# Patient Record
Sex: Male | Born: 1975 | Race: White | Hispanic: No | Marital: Married | Smoking: Current every day smoker
Health system: Southern US, Community
[De-identification: ages and names within clinical notes are randomized; demographics above are authoritative.]

## PROBLEM LIST (undated history)

## (undated) DIAGNOSIS — G919 Hydrocephalus, unspecified: Secondary | ICD-10-CM

## (undated) DIAGNOSIS — Z982 Presence of cerebrospinal fluid drainage device: Secondary | ICD-10-CM

---

## 2014-11-30 ENCOUNTER — Encounter (HOSPITAL_COMMUNITY): Payer: Self-pay | Admitting: Emergency Medicine

## 2014-11-30 ENCOUNTER — Emergency Department (HOSPITAL_COMMUNITY)
Admission: EM | Admit: 2014-11-30 | Discharge: 2014-11-30 | Disposition: A | Discharge status: Home / Self Care | Payer: Self-pay | Attending: Emergency Medicine | Admitting: Emergency Medicine

## 2014-11-30 ENCOUNTER — Emergency Department (HOSPITAL_COMMUNITY): Payer: Self-pay

## 2014-11-30 DIAGNOSIS — Y929 Unspecified place or not applicable: Secondary | ICD-10-CM | POA: Insufficient documentation

## 2014-11-30 DIAGNOSIS — H9223 Otorrhagia, bilateral: Secondary | ICD-10-CM

## 2014-11-30 DIAGNOSIS — R51 Headache: Secondary | ICD-10-CM | POA: Insufficient documentation

## 2014-11-30 DIAGNOSIS — S8992XA Unspecified injury of left lower leg, initial encounter: Secondary | ICD-10-CM | POA: Insufficient documentation

## 2014-11-30 DIAGNOSIS — Z720 Tobacco use: Secondary | ICD-10-CM | POA: Insufficient documentation

## 2014-11-30 DIAGNOSIS — M25562 Pain in left knee: Secondary | ICD-10-CM

## 2014-11-30 DIAGNOSIS — R059 Cough, unspecified: Secondary | ICD-10-CM

## 2014-11-30 DIAGNOSIS — Y9389 Activity, other specified: Secondary | ICD-10-CM | POA: Insufficient documentation

## 2014-11-30 DIAGNOSIS — J069 Acute upper respiratory infection, unspecified: Secondary | ICD-10-CM

## 2014-11-30 DIAGNOSIS — Y998 Other external cause status: Secondary | ICD-10-CM | POA: Insufficient documentation

## 2014-11-30 DIAGNOSIS — W1839XA Other fall on same level, initial encounter: Secondary | ICD-10-CM | POA: Insufficient documentation

## 2014-11-30 DIAGNOSIS — R05 Cough: Secondary | ICD-10-CM

## 2014-11-30 HISTORY — DX: Presence of cerebrospinal fluid drainage device: Z98.2

## 2014-11-30 HISTORY — DX: Hydrocephalus, unspecified: G91.9

## 2014-11-30 MED ORDER — OXYCODONE-ACETAMINOPHEN 5-325 MG PO TABS: 1.0000 | ORAL_TABLET | ORAL | Status: AC | PRN

## 2014-11-30 MED ORDER — AZITHROMYCIN 250 MG PO TABS: ORAL_TABLET | ORAL | Status: AC

## 2014-11-30 NOTE — ED Notes (Signed)
Pt A+Ox4, reports L knee pain 8/10, medial aspect, after falling through a debris pile yesterday "i was looking for something".  Pt denies hitting head or LOC.  Pt reports pain worse with movement.  Mild bruising and swelling noted.  Pt denies n/t to extremity.  Ambulatory with steady gait.  Skin PWD.  Speaking full/clear sentences.  NAD.

## 2014-11-30 NOTE — Discharge Instructions (Signed)
Please return if you develop fever, nausea, vomiting, visual changes, or swelling in the bone behind your ear.   Knee Pain The knee is the complex joint between your thigh and your lower leg. It is made up of bones, tendons, ligaments, and cartilage. The bones that make up the knee are:  The femur in the thigh.  The tibia and fibula in the lower leg.  The patella or kneecap riding in the groove on the lower femur. CAUSES  Knee pain is a common complaint with many causes. A few of these causes are:  Injury, such as:  A ruptured ligament or tendon injury.  Torn cartilage.  Medical conditions, such as:  Gout  Arthritis  Infections  Overuse, over training, or overdoing a physical activity. Knee pain can be minor or severe. Knee pain can accompany debilitating injury. Minor knee problems often respond well to self-care measures or get well on their own. More serious injuries may need medical intervention or even surgery. SYMPTOMS The knee is complex. Symptoms of knee problems can vary widely. Some of the problems are:  Pain with movement and weight bearing.  Swelling and tenderness.  Buckling of the knee.  Inability to straighten or extend your knee.  Your knee locks and you cannot straighten it.  Warmth and redness with pain and fever.  Deformity or dislocation of the kneecap. DIAGNOSIS  Determining what is wrong may be very straight forward such as when there is an injury. It can also be challenging because of the complexity of the knee. Tests to make a diagnosis may include:  Your caregiver taking a history and doing a physical exam.  Routine X-rays can be used to rule out other problems. X-rays will not reveal a cartilage tear. Some injuries of the knee can be diagnosed by:  Arthroscopy a surgical technique by which a small video camera is inserted through tiny incisions on the sides of the knee. This procedure is used to examine and repair internal knee joint  problems. Tiny instruments can be used during arthroscopy to repair the torn knee cartilage (meniscus).  Arthrography is a radiology technique. A contrast liquid is directly injected into the knee joint. Internal structures of the knee joint then become visible on X-ray film.  An MRI scan is a non X-ray radiology procedure in which magnetic fields and a computer produce two- or three-dimensional images of the inside of the knee. Cartilage tears are often visible using an MRI scanner. MRI scans have largely replaced arthrography in diagnosing cartilage tears of the knee.  Blood work.  Examination of the fluid that helps to lubricate the knee joint (synovial fluid). This is done by taking a sample out using a needle and a syringe. TREATMENT The treatment of knee problems depends on the cause. Some of these treatments are:  Depending on the injury, proper casting, splinting, surgery, or physical therapy care will be needed.  Give yourself adequate recovery time. Do not overuse your joints. If you begin to get sore during workout routines, back off. Slow down or do fewer repetitions.  For repetitive activities such as cycling or running, maintain your strength and nutrition.  Alternate muscle groups. For example, if you are a weight lifter, work the upper body on one day and the lower body the next.  Either tight or weak muscles do not give the proper support for your knee. Tight or weak muscles do not absorb the stress placed on the knee joint. Keep the muscles surrounding the  knee strong.  Take care of mechanical problems.  If you have flat feet, orthotics or special shoes may help. See your caregiver if you need help.  Arch supports, sometimes with wedges on the inner or outer aspect of the heel, can help. These can shift pressure away from the side of the knee most bothered by osteoarthritis.  A brace called an "unloader" brace also may be used to help ease the pressure on the most  arthritic side of the knee.  If your caregiver has prescribed crutches, braces, wraps or ice, use as directed. The acronym for this is PRICE. This means protection, rest, ice, compression, and elevation.  Nonsteroidal anti-inflammatory drugs (NSAIDs), can help relieve pain. But if taken immediately after an injury, they may actually increase swelling. Take NSAIDs with food in your stomach. Stop them if you develop stomach problems. Do not take these if you have a history of ulcers, stomach pain, or bleeding from the bowel. Do not take without your caregiver's approval if you have problems with fluid retention, heart failure, or kidney problems.  For ongoing knee problems, physical therapy may be helpful.  Glucosamine and chondroitin are over-the-counter dietary supplements. Both may help relieve the pain of osteoarthritis in the knee. These medicines are different from the usual anti-inflammatory drugs. Glucosamine may decrease the rate of cartilage destruction.  Injections of a corticosteroid drug into your knee joint may help reduce the symptoms of an arthritis flare-up. They may provide pain relief that lasts a few months. You may have to wait a few months between injections. The injections do have a small increased risk of infection, water retention, and elevated blood sugar levels.  Hyaluronic acid injected into damaged joints may ease pain and provide lubrication. These injections may work by reducing inflammation. A series of shots may give relief for as long as 6 months.  Topical painkillers. Applying certain ointments to your skin may help relieve the pain and stiffness of osteoarthritis. Ask your pharmacist for suggestions. Many over the-counter products are approved for temporary relief of arthritis pain.  In some countries, doctors often prescribe topical NSAIDs for relief of chronic conditions such as arthritis and tendinitis. A review of treatment with NSAID creams found that they  worked as well as oral medications but without the serious side effects. PREVENTION  Maintain a healthy weight. Extra pounds put more strain on your joints.  Get strong, stay limber. Weak muscles are a common cause of knee injuries. Stretching is important. Include flexibility exercises in your workouts.  Be smart about exercise. If you have osteoarthritis, chronic knee pain or recurring injuries, you may need to change the way you exercise. This does not mean you have to stop being active. If your knees ache after jogging or playing basketball, consider switching to swimming, water aerobics, or other low-impact activities, at least for a few days a week. Sometimes limiting high-impact activities will provide relief.  Make sure your shoes fit well. Choose footwear that is right for your sport.  Protect your knees. Use the proper gear for knee-sensitive activities. Use kneepads when playing volleyball or laying carpet. Buckle your seat belt every time you drive. Most shattered kneecaps occur in car accidents.  Rest when you are tired. SEEK MEDICAL CARE IF:  You have knee pain that is continual and does not seem to be getting better.  SEEK IMMEDIATE MEDICAL CARE IF:  Your knee joint feels hot to the touch and you have a high fever. MAKE SURE YOU:  Understand these instructions.  Will watch your condition.  Will get help right away if you are not doing well or get worse. Document Released: 09/30/2007 Document Revised: 02/25/2012 Document Reviewed: 09/30/2007 [facility] Patient Information 2015 [employer] [place]. This information is not intended to replace advice given to you by your health care provider. Make sure you discuss any questions you have with your health care provider. Knee, Cartilage (Meniscus) Injury It is suspected that you have a torn cartilage (meniscus) in your knee. The menisci are made of tough cartilage and fit between the surfaces of the thigh and leg bones. The menisci are  C-shaped and have a wedged profile. The wedged profile helps the stability of the joint by keeping the rounded femur surface from sliding off the flat tibial surface. The menisci are fed (nourished) by small blood vessels, but there is also a large area at the inner edge of the meniscus that does not have a good blood supply (avascular). This presents a problem when there is an injury to the meniscus because areas without good blood supply heal poorly. As a result when there is a torn cartilage in the knee, surgery is often required to fix it. This is usually done with a surgical procedure less invasive than open surgery (arthroscopy). Some times open surgery of the knee is required if there is other damage. PURPOSE OF THE MENISCUS The medial meniscus rests on the medial tibial plateau. The tibia is the large bone in your lower leg (the shin bone). The medial tibial plateau is the upper end of the bone making up the inner part of your knee. The lateral meniscus serves the same purpose and is located on the outside of the knee. The menisci help to distribute your body weight across the knee joint; they act as shock absorbers. Without the meniscus present, the weight of your body would be unevenly applied to the bones in your legs (the femur and tibia). The femur is the large bone in your thigh. This uneven weight distribution would cause increased wear and tear on the cartilage lining the joint surfaces, leading to early damage (arthritis) of these areas. The presence of the menisci cartilage is necessary for a healthy knee. PURPOSE OF THE KNEE CARTILAGE The knee joint is made up of three bones: the thigh bone (femur), the shin bone (tibia), and the knee cap (patella). The surfaces of these bones at the knee joint are covered with cartilage called articular cartilage. This smooth, slippery surface allows the bones to slide against each other without causing bone damage. The meniscus sits between these  cartilaginous surfaces of the bones. It distributes the weight evenly in the joints and helps with the stability of the joint (keeps the joint steady). HOME CARE INSTRUCTIONS  Use crutches and external braces as instructed.  Once home, an ice pack applied to your injured knee may help with discomfort and keep the swelling down. An ice pack can be used for the first couple of days or as instructed.  Only take over-the-counter or prescription medicines for pain, discomfort, or fever as directed by your caregiver.  Call if you do not have relief of pain with medications or if there is increasing in pain.  Call if your foot becomes cold or blue.  You may resume normal diet and activities as directed.  Make sure to keep your appointments with your follow-up caregiver. This injury may require further evaluation and treatment beyond the temporary treatment given today. Document Released: 02/23/2003 Document  Revised: 04/19/2014 Document Reviewed: 06/17/2009 [facility] Patient Information 2015 [place], [place]. This information is not intended to replace advice given to you by your health care provider. Make sure you discuss any questions you have with your health care provider.

## 2014-11-30 NOTE — ED Notes (Signed)
Pt ambulatory with steady gait to radiology 

## 2014-11-30 NOTE — ED Provider Notes (Signed)
CSN: [id number]     Arrival date & time 11/30/14  1226 History   This chart was scribed for Arthor CaptainAbigail Christopherjohn Schiele, PA-C, working with Rolan BuccoMelanie Belfi, MD by Jolene Provostobert Halas, ED Scribe. This patient was seen in room WTR5/WTR5 and the patient's care was started at 12:56 PM.    Chief Complaint  Patient presents with  . Knee Pain    Lknee after fall through a debris pile yesterday    HPI  HPI Comments: Nicholas Marshall is a 38 y.o. male who presents to the Emergency Department complaining of sharp persistent left knee pain, worse with walking, that started yesterday afternoon after a fall. Pt states he was crawling on a pile of rubble and his left leg slipped though the surface of the rubble. Pt states he believes he twisted it when it slipped thorough the pile. Pt endorses feelings of grinding, sliding in the joint, swelling, and weakness in left knee. Pt states he took ibuprofen PTA.  Pt is also complaining of bleeding from his left ear. Pt endorses associated global pounding HA, and visual disturbance, dizziness, nasal congestion. Pt denies fever, chills. PT [facility] Pt states he work up this morning and his pillow was covered with blood.   Past Medical History  Diagnosis Date  . S/P VP shunt   . Hydrocephalus    History reviewed. No pertinent past surgical history. No family history on file. History  Substance Use Topics  . Smoking status: Current Every Day Smoker  . Smokeless tobacco: Not on file  . Alcohol Use: No    Review of Systems  HENT: Positive for ear discharge.   Respiratory: Positive for cough and shortness of breath.   Gastrointestinal: Negative for nausea and vomiting.  Musculoskeletal: Positive for joint swelling and gait problem.  Neurological: Positive for dizziness and headaches. Negative for tremors, seizures, syncope, facial asymmetry, speech difficulty, weakness and light-headedness.  All other systems reviewed and are negative.   Allergies   Hydrocodone and Tramadol  Home Medications   Prior to Admission medications   Not on File   BP 136/84 mmHg  Pulse 95  Temp(Src) 99.4 F (37.4 C) (Oral)  Resp 18  SpO2 94% Physical Exam  Constitutional: He is oriented to person, place, and time. He appears well-developed and well-nourished.  HENT:  Head: Normocephalic and atraumatic.  Dried blood left canal, as well as spot on dried blood on TM. Mastoid tenderness on left. Right ear with dried blood in canal, blood on TM as well as a circular lesion. TM  is opaque and yellow on appearance. No mastoid tenderness on right.  Eyes: Pupils are equal, round, and reactive to light.  Neck: Neck supple.  Cardiovascular: Normal rate and regular rhythm.   Pulmonary/Chest: Effort normal and breath sounds normal. No respiratory distress.  Musculoskeletal: He exhibits edema and tenderness.  Tenderness along the medial joint line, minimal swelling ROM limited due to pain. Full extension pain past 90 degrees of flexion. Positive McMurray's test.   Neurological: He is alert and oriented to person, place, and time.  Speech is clear and goal oriented, follows commands Major Cranial nerves without deficit, no facial droop Normal strength in upper and lower extremities bilaterally including dorsiflexion and plantar flexion, strong and equal grip strength Sensation normal to light and sharp touch Moves extremities without ataxia, coordination intact Normal finger to nose and rapid alternating movements Neg romberg, no pronator drift Normal heel-shin and balance   Skin:  Skin is warm and dry.  Psychiatric: He has a normal mood and affect. His behavior is normal.  Nursing note and vitals reviewed.   ED Course  Procedures  DIAGNOSTIC STUDIES: Oxygen Saturation is 94% on RA, adequate by my interpretation.    COORDINATION OF CARE: 1:04 PM Discussed treatment plan with pt at bedside and pt agreed to plan.  Labs Review Labs Reviewed - No data to  display  Imaging Review No results found.   EKG Interpretation None      MDM   Final diagnoses:  Left knee pain  Cough  URI (upper respiratory infection)  Bleeding from ear, bilateral    Patient with Negative L knee xray. Suspect meniscal tear. Knee immobilizer, crutches and pain medication. Follow-up with orthopedics. Concerning the patient's ears and left mastoid tenderness. Patient is unable to stay today due to the fact that he has to go pick his children up from school. I discussed this with the patient and will discharge him with azithromycin. I discussed reasons that he should seek immediate medical attention at the emergency department. Patient has follow-up appointment at his neurosurgeon's this coming Thursday. He appears safe for discharge at this time. He has no focal neuro deficits.  I personally performed the services described in this documentation, which was scribed in my presence. The recorded information has been reviewed and is accurate.      Arthor CaptainAbigail Jasma Seevers, PA-C 11/30/14 [id number]  Rolan BuccoMelanie Belfi, MD 12/03/14 [phone]

## 2014-11-30 NOTE — ED Notes (Signed)
Knee immobilizer and crutches provided.  Teaching provided, pt verbalized and demonstrated understanding.

## 2016-05-04 IMAGING — CR DG KNEE COMPLETE 4+V*L*
4 series · 4 of 4 positions shown · non-contrast
Comparison: None.

CLINICAL DATA: Medial left knee pain since a fall 11/29/2014.
Initial encounter.

EXAM:
LEFT KNEE - COMPLETE 4+ VIEW

[t knee ap left]
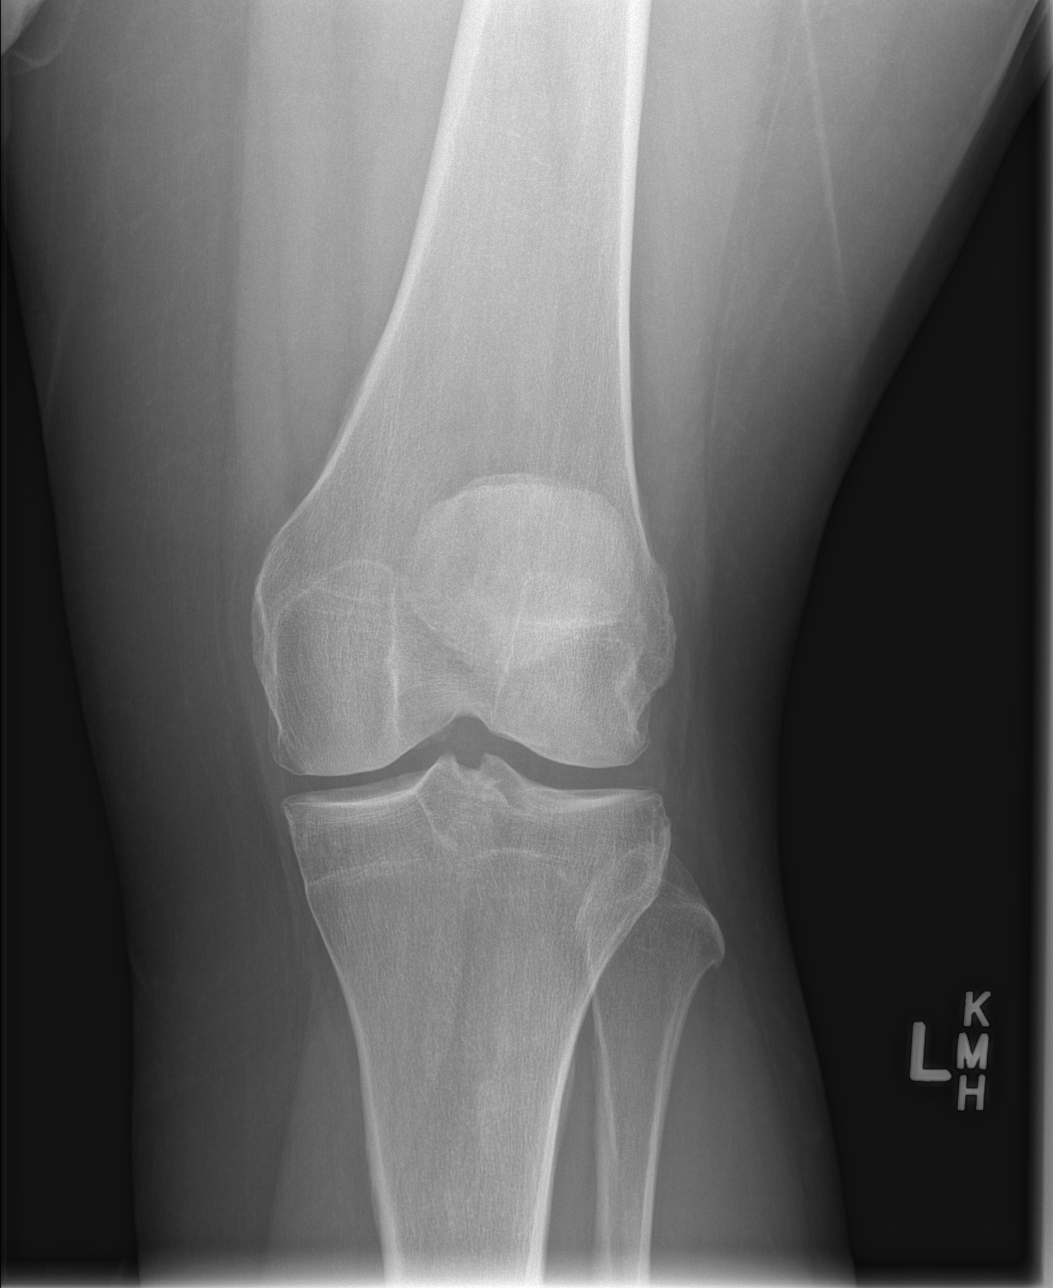

[t knee obl left (1 of 2)]
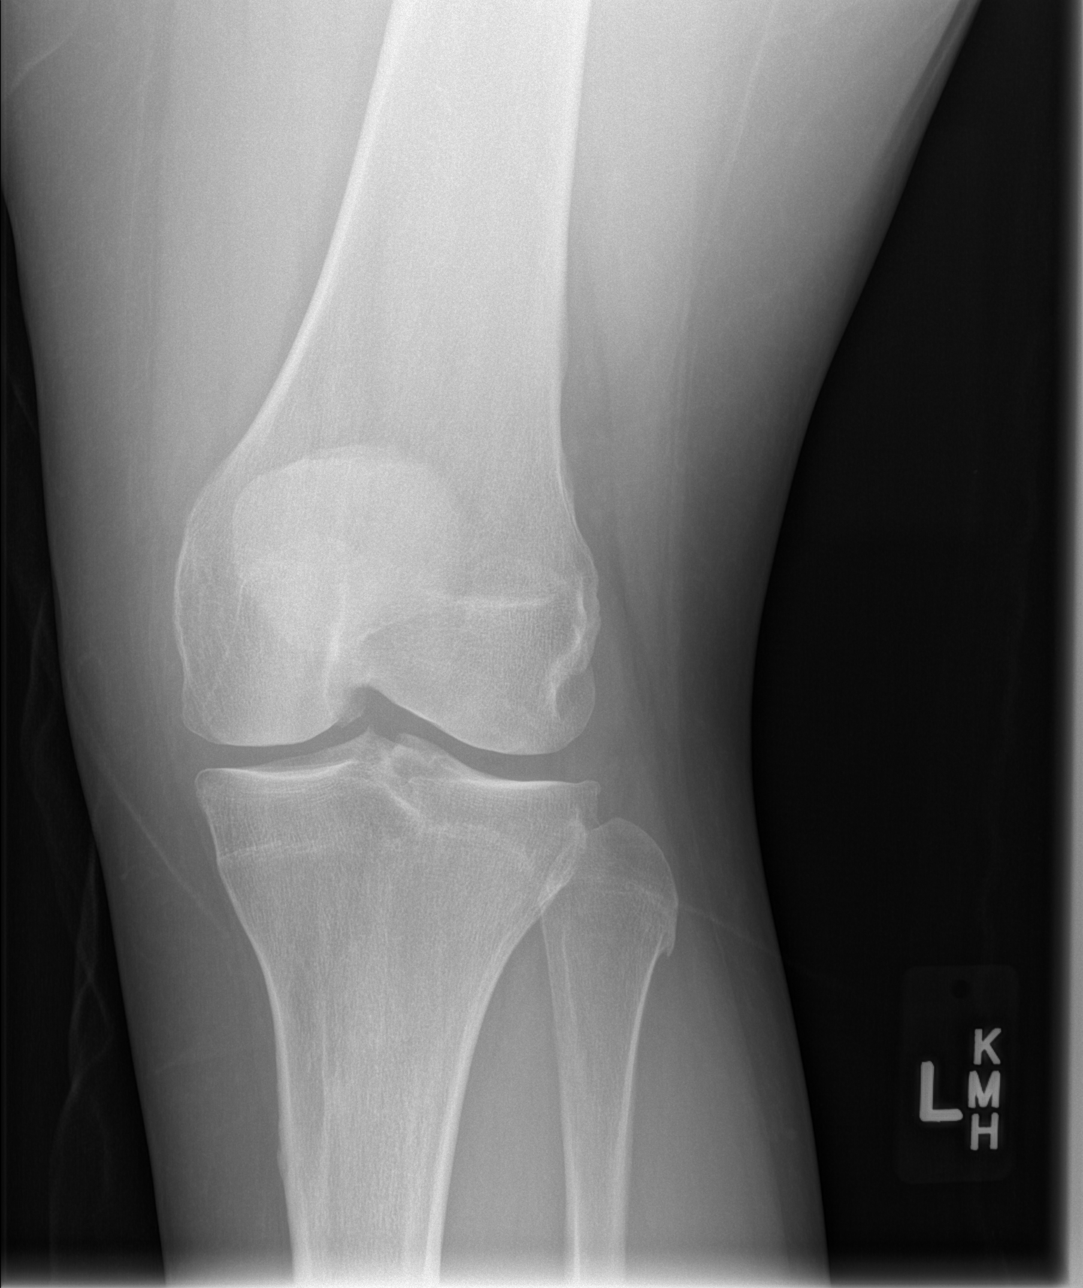

[t knee obl left (2 of 2)]
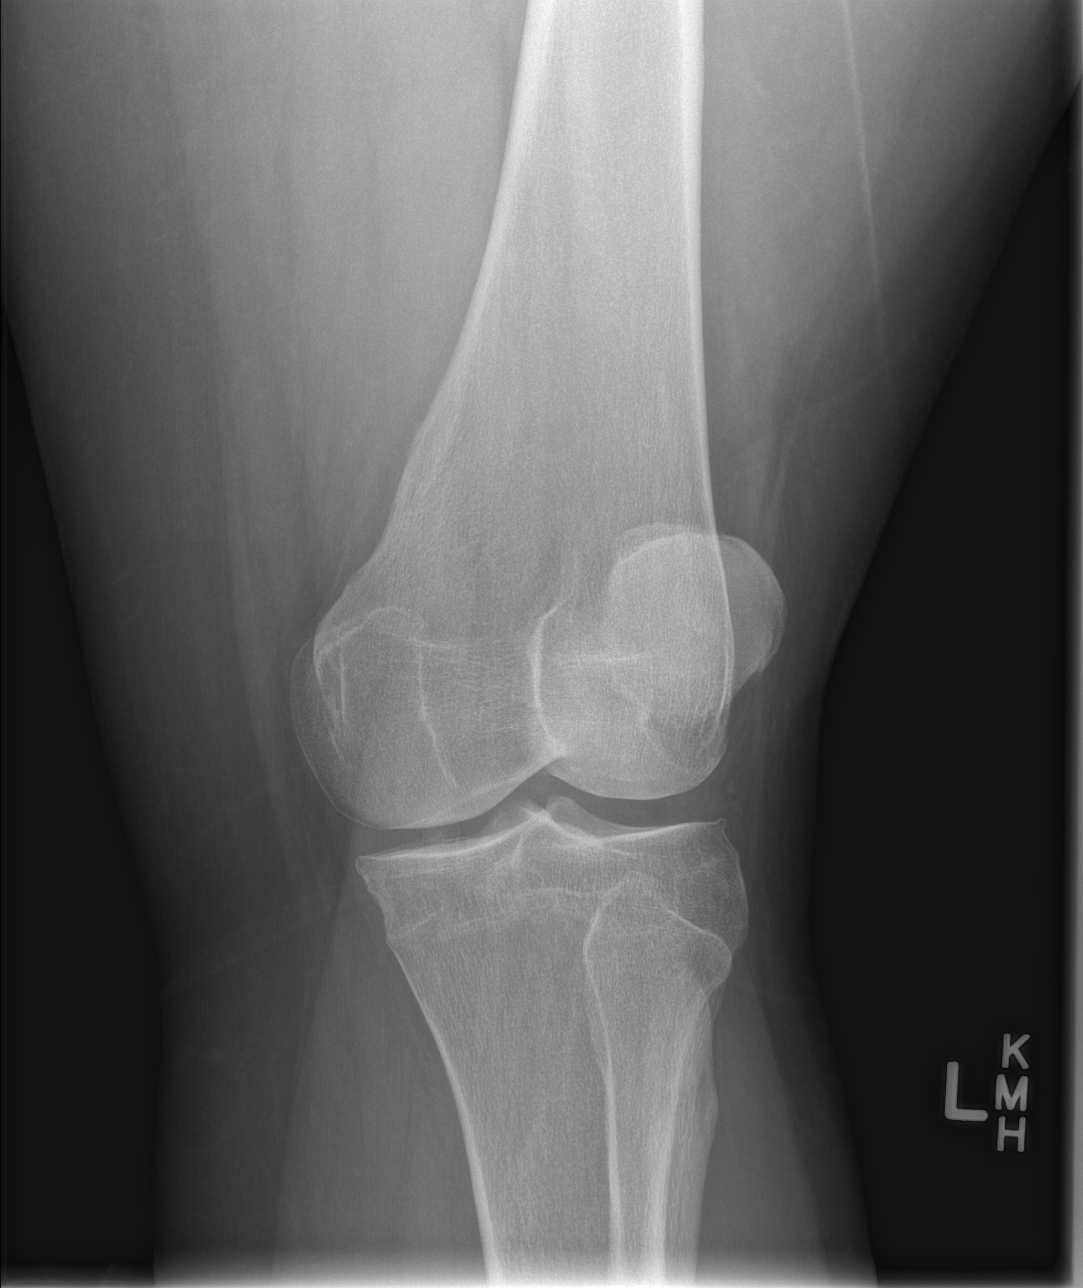

[t knee lat left]
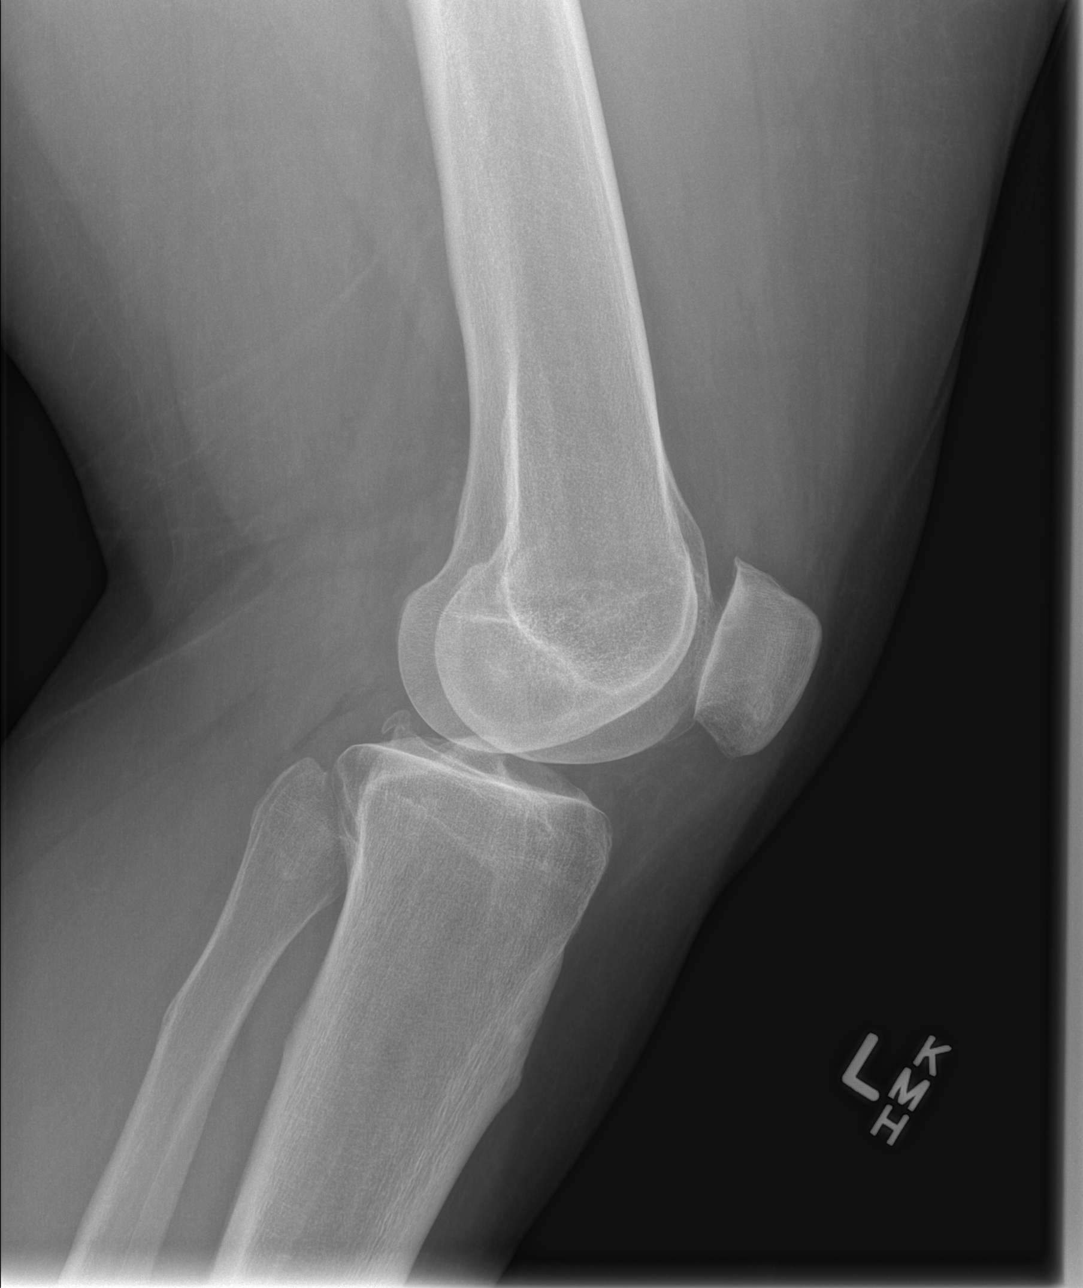

[4 of 4 positions shown; findings below may reference images not displayed]

FINDINGS: Imaged bones, joints and soft tissues appear normal.
IMPRESSION: Negative exam.
